# Patient Record
Sex: Female | Born: 1970 | Race: White | Hispanic: No | Marital: Married
Health system: Southern US, Community
[De-identification: ages and names within clinical notes are randomized; demographics above are authoritative.]

---

## 2009-06-04 ENCOUNTER — Emergency Department: Payer: Self-pay | Admitting: Emergency Medicine

## 2009-06-06 ENCOUNTER — Other Ambulatory Visit: Payer: Self-pay | Admitting: Unknown Physician Specialty

## 2009-06-06 ENCOUNTER — Ambulatory Visit: Payer: Self-pay

## 2010-08-24 IMAGING — US US OB < 14 WEEKS
1 series · 17 of 28 positions shown · non-contrast
Comparison: none

RESULT:      Transabdominal and endovaginal scanning of the pelvis
demonstrates the kidneys show no gross abnormality or obstruction. Within
the uterus there is a low-attenuation collection suggestive of a gestational
sac with a mean diameter of 1.4 cm consistent with a 5 week 4 day gestation.
There is a simple left ovarian cyst measuring 2.6 cm in maximal diameter.
Blood flow seen to the left ovary. The right ovary is nonvisualized. The
uterus shows no definite mass. There is no evidence of free fluid.

The patient has a positive pregnancy test. No fetal pole is seen at current
time. Clinical and imaging followup as well as laboratory followup is
recommended.

[Series 1: us ob < 14 weeks · 17 of 39 slices shown]
[im 1/39]
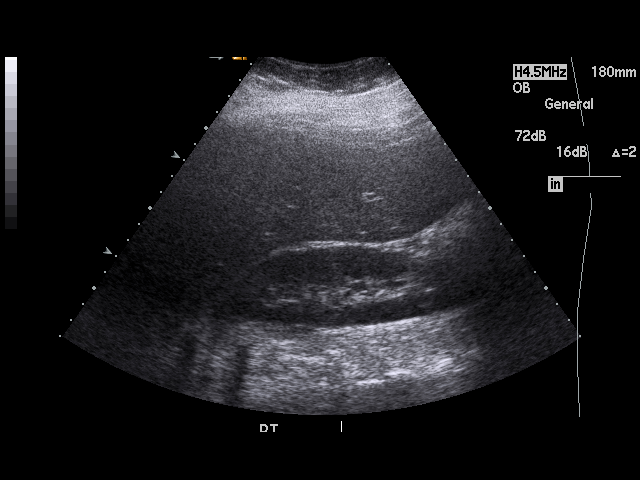
[im 3/39]
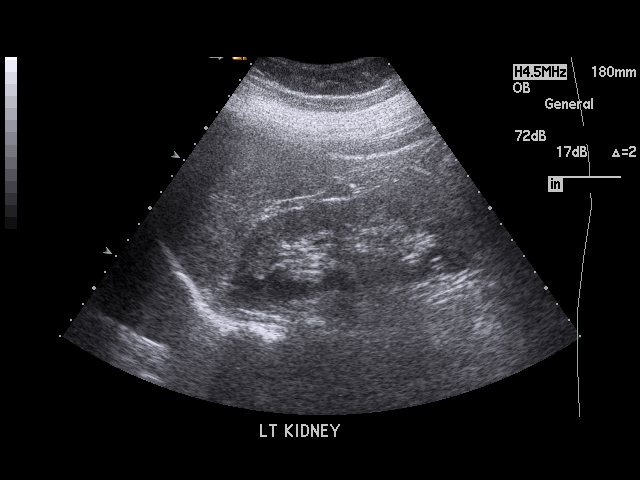
[im 6/39]
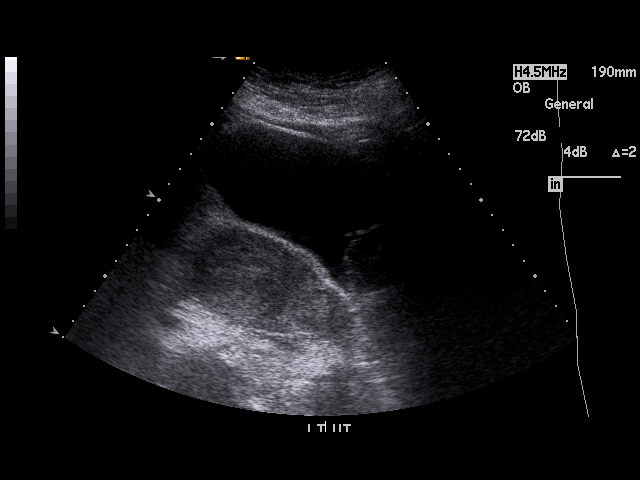
[im 8/39]
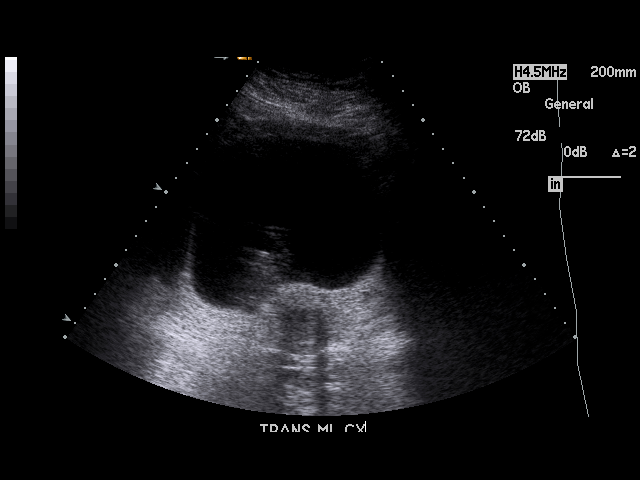
[im 10/39]
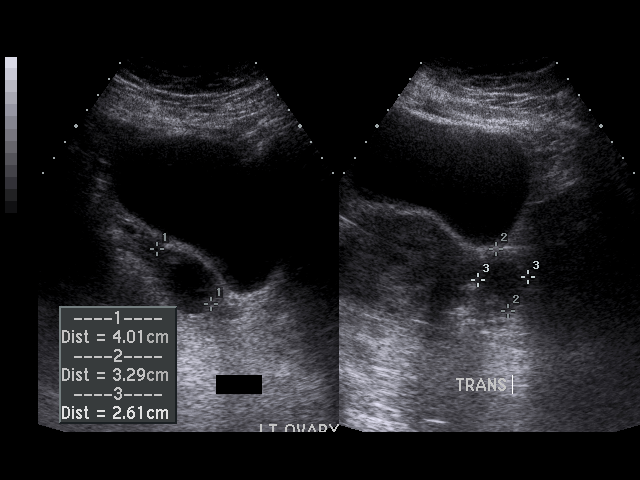
[im 13/39]
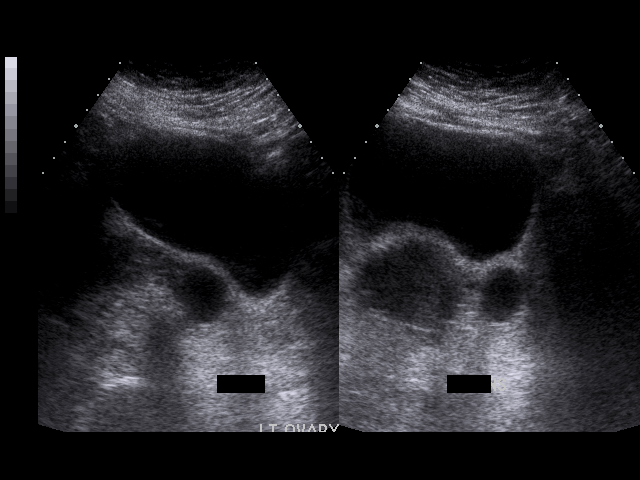
[im 15/39]
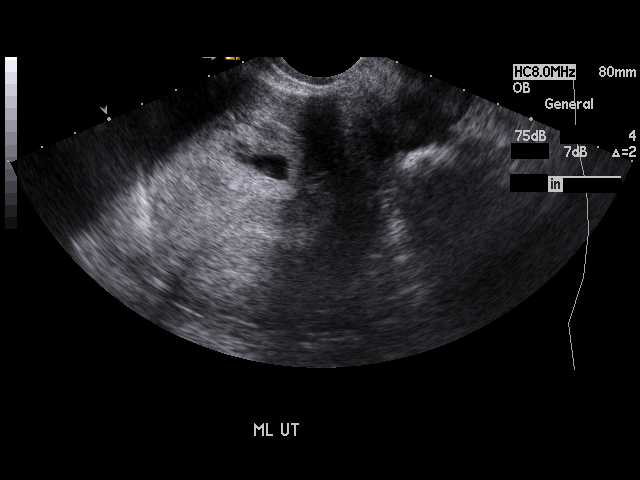
[im 17/39]
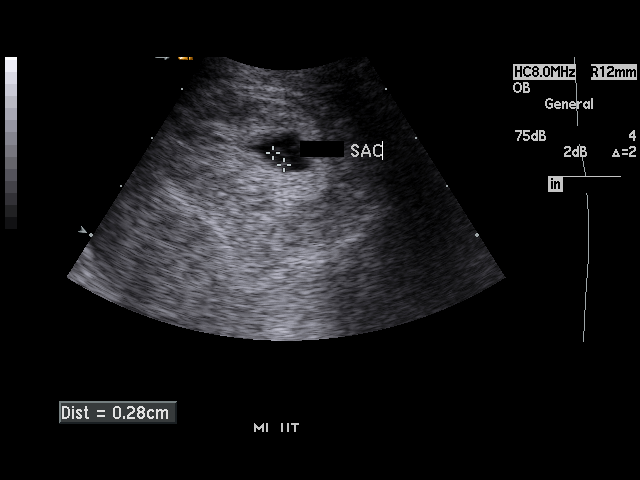
[im 20/39]
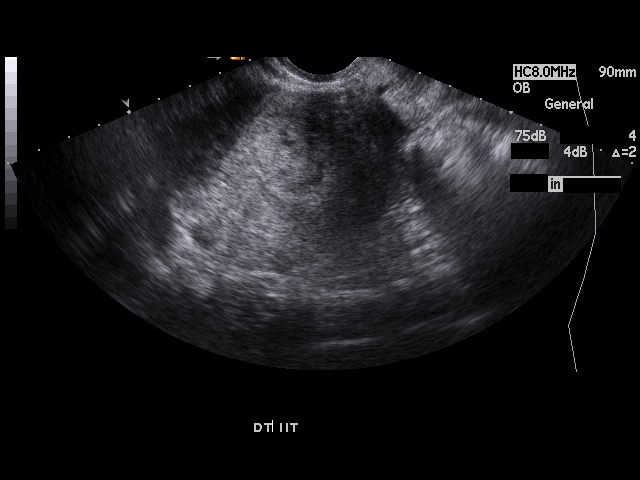
[im 22/39]
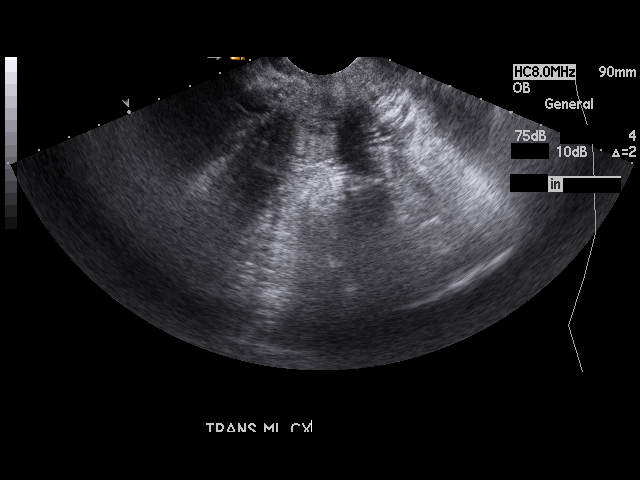
[im 24/39]
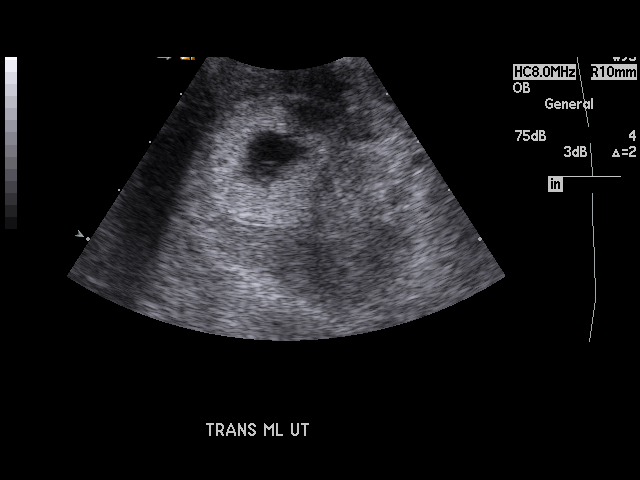
[im 26/39]
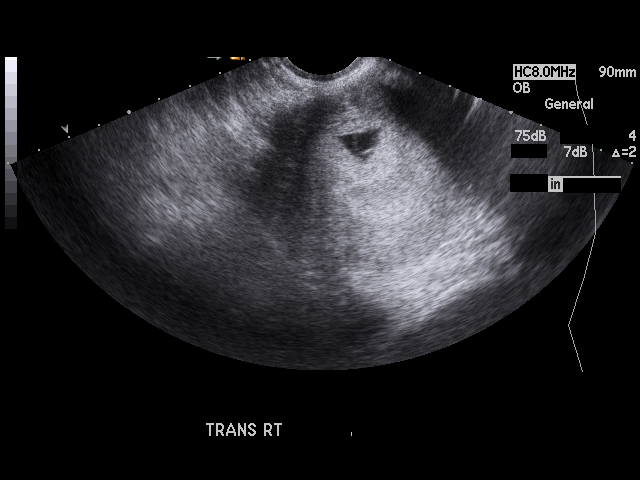
[im 29/39]
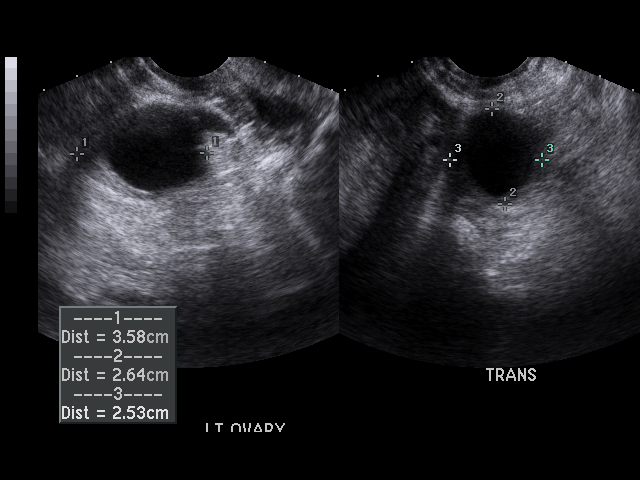
[im 31/39]
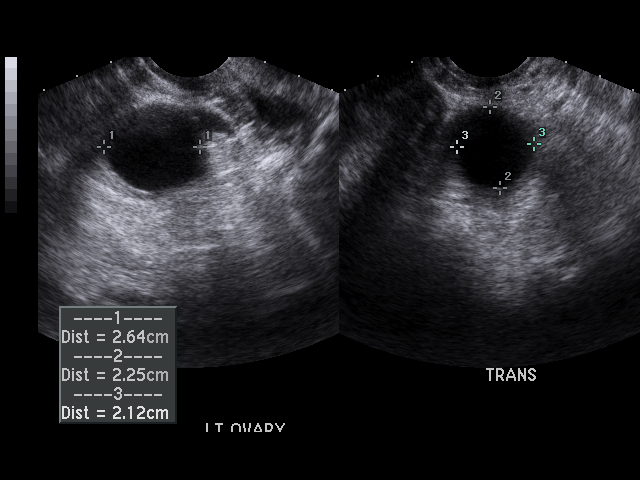
[im 33/39]
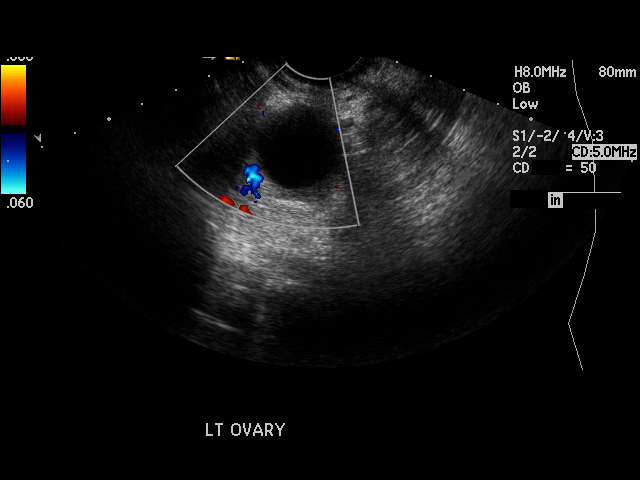
[im 36/39]
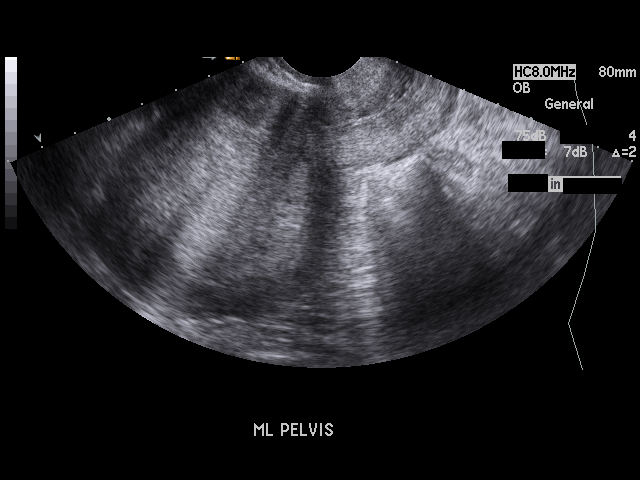
[im 39/39]
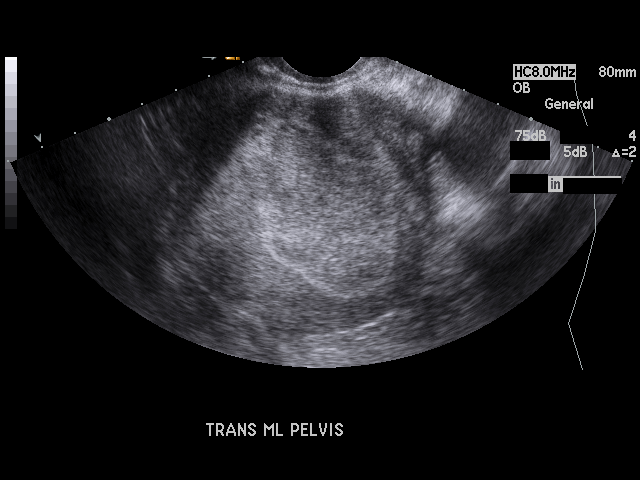

[17 of 28 positions shown; findings below may reference images not displayed]

IMPRESSION: Intrauterine fluid collection possibly representing a
gestational sac of 5 weeks 4 day but no fetal pole was identified. Followup
is recommended. There is a left ovarian simple cyst.

## 2016-06-10 DIAGNOSIS — Z9884 Bariatric surgery status: Secondary | ICD-10-CM | POA: Insufficient documentation

## 2019-05-26 DIAGNOSIS — E669 Obesity, unspecified: Secondary | ICD-10-CM | POA: Insufficient documentation

## 2019-05-26 DIAGNOSIS — F411 Generalized anxiety disorder: Secondary | ICD-10-CM | POA: Insufficient documentation

## 2019-05-26 DIAGNOSIS — Z716 Tobacco abuse counseling: Secondary | ICD-10-CM | POA: Insufficient documentation

## 2019-05-26 DIAGNOSIS — F17200 Nicotine dependence, unspecified, uncomplicated: Secondary | ICD-10-CM | POA: Insufficient documentation

## 2019-05-26 DIAGNOSIS — D509 Iron deficiency anemia, unspecified: Secondary | ICD-10-CM | POA: Insufficient documentation

## 2020-03-19 DIAGNOSIS — E559 Vitamin D deficiency, unspecified: Secondary | ICD-10-CM | POA: Insufficient documentation

## 2020-03-19 DIAGNOSIS — N920 Excessive and frequent menstruation with regular cycle: Secondary | ICD-10-CM | POA: Insufficient documentation

## 2020-12-10 ENCOUNTER — Ambulatory Visit (INDEPENDENT_AMBULATORY_CARE_PROVIDER_SITE_OTHER): Payer: BC Managed Care – PPO | Admitting: Podiatry

## 2020-12-10 ENCOUNTER — Ambulatory Visit (INDEPENDENT_AMBULATORY_CARE_PROVIDER_SITE_OTHER): Payer: BC Managed Care – PPO

## 2020-12-10 ENCOUNTER — Other Ambulatory Visit: Payer: Self-pay

## 2020-12-10 ENCOUNTER — Ambulatory Visit: Payer: BC Managed Care – PPO

## 2020-12-10 DIAGNOSIS — R0683 Snoring: Secondary | ICD-10-CM | POA: Insufficient documentation

## 2020-12-10 DIAGNOSIS — R7301 Impaired fasting glucose: Secondary | ICD-10-CM | POA: Insufficient documentation

## 2020-12-10 DIAGNOSIS — Q666 Other congenital valgus deformities of feet: Secondary | ICD-10-CM | POA: Diagnosis not present

## 2020-12-10 DIAGNOSIS — M170 Bilateral primary osteoarthritis of knee: Secondary | ICD-10-CM | POA: Insufficient documentation

## 2020-12-10 DIAGNOSIS — M79672 Pain in left foot: Secondary | ICD-10-CM

## 2020-12-11 NOTE — Progress Notes (Signed)
  Subjective:  Patient ID: Makayla Gregory, female    DOB: [birth year],  MRN: [id number]  Chief Complaint  Patient presents with   Numbness    Right foot numbness     50 y.o. female presents with the above complaint.  He patient presents with complaint of bilateral arch and heel pain.  Patient states it hurts of both of the foot.  Left is greater than right side.  She does have a history of flatfoot.  She does not wear any orthotics.  She wears okay shoe brands.  She would like to discuss treatment options she denies any other acute complaints.  She states she is getting tingling and sensation throughout the whole foot.   Review of Systems: Negative except as noted in the HPI. Denies N/V/F/Ch.  No past medical history on file.  Current Outpatient Medications:    azithromycin (ZITHROMAX Z-PAK) 250 MG tablet, , Disp: , Rfl:    buPROPion (WELLBUTRIN XL) 150 MG 24 hr tablet, Take 1 tablet by mouth every morning., Disp: , Rfl:    citalopram (CELEXA) 20 MG tablet, Take by mouth., Disp: , Rfl:    iron polysaccharides (NIFEREX) 150 MG capsule, Take by mouth., Disp: , Rfl:    methylPREDNISolone (MEDROL) 4 MG TBPK tablet, , Disp: , Rfl:    buPROPion (WELLBUTRIN XL) 150 MG 24 hr tablet, Take 1 tablet by mouth daily as needed., Disp: , Rfl:    citalopram (CELEXA) 20 MG tablet, Take 20 mg by mouth daily., Disp: , Rfl:    FERREX 150 150 MG capsule, Take by mouth., Disp: , Rfl:   Social History   Tobacco Use  Smoking Status Not on file  Smokeless Tobacco Not on file    Allergies  Allergen Reactions   Aspirin     Other reaction(s): Abdominal Pain Other reaction(s): Abdominal Pain    Objective:  There were no vitals filed for this visit. There is no height or weight on file to calculate BMI. Constitutional Well developed. Well nourished.  Vascular Dorsalis pedis pulses palpable bilaterally. Posterior tibial pulses palpable bilaterally. Capillary refill normal to all digits.  No  cyanosis or clubbing noted. Pedal hair growth normal.  Neurologic Normal speech. Oriented to person, place, and time. Epicritic sensation to light touch grossly present bilaterally.  Dermatologic Nails well groomed and normal in appearance. No open wounds. No skin lesions.  Orthopedic: Gait examination shows pes planovalgus foot structure with calcaneovalgus to many toe signs partially recurrent the arch with dorsiflexion of the hallux.  Patient is able to perform single and double heel raises.   Radiographs: 3 views of skeletally mature adult left foot: There is decreasing calcaneal inclination angle increasing talar declination, mild anterior break in the cyma line.  Mild elevatus noted.  No other bony abnormalities identified Assessment:   1. Left foot pain    Plan:  Patient was evaluated and treated and all questions answered.  Bilateral pes planovalgus -I explained the patient the etiology of pes planovalgus numbers treatment options were discussed.  Ultimately I believe that this might likely be attributed to not having enough support to the heel and the arches especially when standing for long period of time.  I discussed this with the patient she will benefit from custom-made orthotics.  She will be scheduled to see EJ for custom-made orthotics.  No follow-ups on file.

## 2020-12-16 ENCOUNTER — Other Ambulatory Visit: Payer: BC Managed Care – PPO

## 2021-03-13 ENCOUNTER — Ambulatory Visit: Payer: Self-pay | Admitting: *Deleted

## 2021-03-13 ENCOUNTER — Telehealth: Payer: Self-pay

## 2021-03-13 NOTE — Telephone Encounter (Signed)
Copied from CRM [id number]. Topic: Appointment Scheduling - Scheduling Inquiry for Clinic >> Mar 13, 2021 10:34 AM Randol Kern wrote: Reason for CRM: Pt wants to establish with [facility], please advise  [phone]

## 2021-03-13 NOTE — Telephone Encounter (Signed)
Reason for Disposition  [1] MILD dizziness (e.g., walking normally) AND [2] has NOT been evaluated by physician for this  (Exception: dizziness caused by heat exposure, sudden standing, or poor fluid intake)  Answer Assessment - Initial Assessment Questions 1. DESCRIPTION: "Describe your dizziness."     Off balance 2. LIGHTHEADED: "Do you feel lightheaded?" (e.g., somewhat faint, woozy, weak upon standing)     Nausea- feels faint 3. VERTIGO: "Do you feel like either you or the room is spinning or tilting?" (i.e. vertigo)     yes 4. SEVERITY: "How bad is it?"  "Do you feel like you are going to faint?" "Can you stand and walk?"   - MILD: Feels slightly dizzy, but walking normally.   - MODERATE: Feels unsteady when walking, but not falling; interferes with normal activities (e.g., school, work).   - SEVERE: Unable to walk without falling, or requires assistance to walk without falling; feels like passing out now.      mild 5. ONSET:  "When did the dizziness begin?"     This morning 6. AGGRAVATING FACTORS: "Does anything make it worse?" (e.g., standing, change in head position)     Changing head position 7. HEART RATE: "Can you tell me your heart rate?" "How many beats in 15 seconds?"  (Note: not all patients can do this)       No change 8. CAUSE: "What do you think is causing the dizziness?"     unsure 9. RECURRENT SYMPTOM: "Have you had dizziness before?" If Yes, ask: "When was the last time?" "What happened that time?"     no 10. OTHER SYMPTOMS: "Do you have any other symptoms?" (e.g., fever, chest pain, vomiting, diarrhea, bleeding)       Nausea, loose stool,chills, hot flashes 11. PREGNANCY: "Is there any chance you are pregnant?" "When was your last menstrual period?"       N/a  Protocols used: Dizziness - Lightheadedness-A-AH

## 2021-03-13 NOTE — Telephone Encounter (Signed)
Patient has called for NP appointment- patient has dizziness that she states gets worse with head movement- she is able to walk normally. Patient advised UC for evaluation for this until she can be seen.

## 2021-03-14 NOTE — Telephone Encounter (Signed)
Advised pt that we are currently not accepting new pt's.

## 2021-04-25 DIAGNOSIS — Z23 Encounter for immunization: Secondary | ICD-10-CM | POA: Diagnosis not present
